# Patient Record
Sex: Male | Born: 1990 | Race: Black or African American | Hispanic: No | Marital: Single | Smoking: Current every day smoker
Health system: Southern US, Community
[De-identification: ages and names within clinical notes are randomized; demographics above are authoritative.]

## PROBLEM LIST (undated history)

## (undated) DIAGNOSIS — S83519A Sprain of anterior cruciate ligament of unspecified knee, initial encounter: Secondary | ICD-10-CM

## (undated) HISTORY — PX: OTHER SURGICAL HISTORY: SHX169

---

## 2015-08-23 ENCOUNTER — Emergency Department (HOSPITAL_COMMUNITY)
Admission: EM | Admit: 2015-08-23 | Discharge: 2015-08-23 | Disposition: A | Discharge status: Home / Self Care | Payer: No Typology Code available for payment source | Attending: Emergency Medicine | Admitting: Emergency Medicine

## 2015-08-23 ENCOUNTER — Encounter (HOSPITAL_COMMUNITY): Payer: Self-pay | Admitting: *Deleted

## 2015-08-23 ENCOUNTER — Emergency Department (HOSPITAL_COMMUNITY): Payer: No Typology Code available for payment source

## 2015-08-23 DIAGNOSIS — Y9389 Activity, other specified: Secondary | ICD-10-CM | POA: Insufficient documentation

## 2015-08-23 DIAGNOSIS — S39012A Strain of muscle, fascia and tendon of lower back, initial encounter: Secondary | ICD-10-CM | POA: Diagnosis not present

## 2015-08-23 DIAGNOSIS — Y998 Other external cause status: Secondary | ICD-10-CM | POA: Diagnosis not present

## 2015-08-23 DIAGNOSIS — S199XXA Unspecified injury of neck, initial encounter: Secondary | ICD-10-CM | POA: Diagnosis present

## 2015-08-23 DIAGNOSIS — Y92410 Unspecified street and highway as the place of occurrence of the external cause: Secondary | ICD-10-CM | POA: Diagnosis not present

## 2015-08-23 DIAGNOSIS — S161XXA Strain of muscle, fascia and tendon at neck level, initial encounter: Secondary | ICD-10-CM | POA: Insufficient documentation

## 2015-08-23 HISTORY — DX: Sprain of anterior cruciate ligament of unspecified knee, initial encounter: S83.519A

## 2015-08-23 MED ORDER — TRAMADOL HCL 50 MG PO TABS: 50.0000 mg | ORAL_TABLET | Freq: Four times a day (QID) | ORAL | Status: AC | PRN

## 2015-08-23 MED ORDER — IBUPROFEN 800 MG PO TABS: 800.0000 mg | ORAL_TABLET | Freq: Three times a day (TID) | ORAL | Status: AC | PRN
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
## 2015-08-23 NOTE — ED Notes (Signed)
Declined W/C at D/C and was escorted to lobby by RN. 
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
## 2018-05-25 ENCOUNTER — Encounter (HOSPITAL_BASED_OUTPATIENT_CLINIC_OR_DEPARTMENT_OTHER): Payer: Self-pay | Admitting: Emergency Medicine

## 2018-05-25 ENCOUNTER — Other Ambulatory Visit: Payer: Self-pay

## 2018-05-25 ENCOUNTER — Emergency Department (HOSPITAL_BASED_OUTPATIENT_CLINIC_OR_DEPARTMENT_OTHER)
Admission: EM | Admit: 2018-05-25 | Discharge: 2018-05-25 | Disposition: A | Discharge status: Home / Self Care | Payer: No Typology Code available for payment source | Attending: Emergency Medicine | Admitting: Emergency Medicine

## 2018-05-25 DIAGNOSIS — M542 Cervicalgia: Secondary | ICD-10-CM | POA: Insufficient documentation

## 2018-05-25 DIAGNOSIS — M25512 Pain in left shoulder: Secondary | ICD-10-CM | POA: Insufficient documentation

## 2018-05-25 DIAGNOSIS — F17210 Nicotine dependence, cigarettes, uncomplicated: Secondary | ICD-10-CM | POA: Diagnosis not present

## 2018-05-25 MED ORDER — CYCLOBENZAPRINE HCL 10 MG PO TABS: 10.0000 mg | ORAL_TABLET | Freq: Every evening | ORAL | 0 refills | Status: AC | PRN
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
## 2018-05-25 NOTE — Discharge Instructions (Signed)
Take Ibuprofen or Tylenol as needed for the next week. Take this medicine with food. Take muscle relaxer at bedtime to help you sleep. This medicine makes you drowsy so do not take before driving or work Use a heating pad for sore muscles - use for 20 minutes several times a day Return for worsening symptoms

## 2018-05-27 ENCOUNTER — Emergency Department (HOSPITAL_COMMUNITY)
Admission: EM | Admit: 2018-05-27 | Discharge: 2018-05-27 | Disposition: A | Discharge status: Home / Self Care | Payer: No Typology Code available for payment source | Attending: Emergency Medicine | Admitting: Emergency Medicine

## 2018-05-27 ENCOUNTER — Encounter (HOSPITAL_COMMUNITY): Payer: Self-pay | Admitting: *Deleted

## 2018-05-27 DIAGNOSIS — M25512 Pain in left shoulder: Secondary | ICD-10-CM | POA: Diagnosis not present

## 2018-05-27 DIAGNOSIS — Z79899 Other long term (current) drug therapy: Secondary | ICD-10-CM | POA: Insufficient documentation

## 2018-05-27 DIAGNOSIS — F17210 Nicotine dependence, cigarettes, uncomplicated: Secondary | ICD-10-CM | POA: Diagnosis not present
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
## 2018-05-27 NOTE — Discharge Instructions (Signed)
Please follow-up with the orthopedics team if your symptoms continue.  Please use your home medicines to help with the symptoms.  Please rest.  If any symptoms change or worsen, please return to the nearest emergency department.

## 2018-06-09 ENCOUNTER — Emergency Department (HOSPITAL_COMMUNITY)
Admission: EM | Admit: 2018-06-09 | Discharge: 2018-06-09 | Disposition: A | Discharge status: Home / Self Care | Payer: Self-pay | Attending: Emergency Medicine | Admitting: Emergency Medicine

## 2018-06-09 ENCOUNTER — Encounter (HOSPITAL_COMMUNITY): Payer: Self-pay | Admitting: *Deleted

## 2018-06-09 DIAGNOSIS — F17210 Nicotine dependence, cigarettes, uncomplicated: Secondary | ICD-10-CM | POA: Insufficient documentation

## 2018-06-09 DIAGNOSIS — L0231 Cutaneous abscess of buttock: Secondary | ICD-10-CM | POA: Insufficient documentation

## 2018-06-09 DIAGNOSIS — Z79899 Other long term (current) drug therapy: Secondary | ICD-10-CM | POA: Insufficient documentation

## 2018-06-09 MED ORDER — LIDOCAINE HCL (PF) 1 % IJ SOLN
5.0000 mL | Freq: Once | INTRAMUSCULAR | Status: AC
  Administered 2018-06-09: 5 mL
  Filled 2018-06-09: qty 5

## 2018-06-09 MED ORDER — DOXYCYCLINE HYCLATE 100 MG PO TABS
100.0000 mg | ORAL_TABLET | Freq: Once | ORAL | Status: AC
  Administered 2018-06-09: 100 mg via ORAL
  Filled 2018-06-09: qty 1

## 2018-06-09 MED ORDER — DOXYCYCLINE HYCLATE 100 MG PO CAPS: 100.0000 mg | ORAL_CAPSULE | Freq: Two times a day (BID) | ORAL | 0 refills | Status: AC

## 2018-06-09 NOTE — ED Triage Notes (Signed)
Pt in c/o abscess on his buttocks, history of same, denies fever, no distress noted

## 2018-06-09 NOTE — Discharge Instructions (Addendum)
Take doxycycline until completed.  Continue warm compresses or warm soaks to continue drainage of your abscess.  Please return in 2 days for recheck.  You can take Tylenol and ibuprofen as prescribed over-the-counter, as needed for pain.  Please return sooner if you develop any increasing pain, redness, swelling, red streaking from the area, pain with bowel movements, pain in your scrotum, or any other new or concerning symptoms.

## 2018-06-10 NOTE — ED Provider Notes (Signed)
[facility] EMERGENCY DEPARTMENT Provider Note   CSN: [id number] Arrival date & time: 06/09/18  1839     History   Chief Complaint Chief Complaint  Patient presents with  . Abscess    HPI Austin Lang is a 28 y.o. male with history of abscess who presents with abscess on his buttocks for the last few days.  He has no pain with bowel movements or pain in his scrotum. There has been no drainage. He denies fevers. He has been using warm compresses without relief. He had an abscess drained in the same place last year at urgent care.  HPI  Past Medical History:  Diagnosis Date  . ACL injury tear    LT knee    There are no active problems to display for this patient.   Past Surgical History:  Procedure Laterality Date  . orthoscop Left    LT ACL repair        Home Medications    Prior to Admission medications   Medication Sig Start Date End Date Taking? Authorizing Provider  cyclobenzaprine (FLEXERIL) 10 MG tablet Take 1 tablet (10 mg total) by mouth at bedtime and may repeat dose one time if needed. 05/25/18   Bethel Born, PA-C  doxycycline (VIBRAMYCIN) 100 MG capsule Take 1 capsule (100 mg total) by mouth 2 (two) times daily. 06/09/18   Lisandro Meggett, Waylan Boga, PA-C  ibuprofen (ADVIL,MOTRIN) 800 MG tablet Take 1 tablet (800 mg total) by mouth every 8 (eight) hours as needed. 08/23/15   Lawyer, Cristal Deer, PA-C  traMADol (ULTRAM) 50 MG tablet Take 1 tablet (50 mg total) by mouth every 6 (six) hours as needed for severe pain. 08/23/15   Charlestine Night, PA-C    Family History History reviewed. No pertinent family history.  Social History Social History   Tobacco Use  . Smoking status: Current Every Day Smoker    Packs/day: 0.50    Types: Cigars  . Smokeless tobacco: Never Used  Substance Use Topics  . Alcohol use: Yes    Comment: weekend  . Drug use: No     Allergies   Patient has no known allergies.   Review of Systems Review  of Systems  Constitutional: Negative for fever.  Skin: Positive for wound.     Physical Exam Updated Vital Signs BP 137/81 (BP Location: Right Arm)   Pulse 90   Temp 98.6 F (37 C) (Oral)   Resp 16   SpO2 99%   Physical Exam Vitals signs and nursing note reviewed. Exam conducted with a chaperone present.  Constitutional:      General: He is not in acute distress.    Appearance: He is well-developed. He is not diaphoretic.  HENT:     Head: Normocephalic and atraumatic.     Mouth/Throat:     Pharynx: No oropharyngeal exudate.  Eyes:     General: No scleral icterus.       Right eye: No discharge.        Left eye: No discharge.     Conjunctiva/sclera: Conjunctivae normal.     Pupils: Pupils are equal, round, and reactive to light.  Neck:     Musculoskeletal: Normal range of motion and neck supple.     Thyroid: No thyromegaly.  Cardiovascular:     Rate and Rhythm: Normal rate and regular rhythm.     Heart sounds: Normal heart sounds. No murmur. No friction rub. No gallop.   Pulmonary:     Effort:  Pulmonary effort is normal. No respiratory distress.     Breath sounds: Normal breath sounds. No stridor. No wheezing or rales.  Abdominal:     General: Bowel sounds are normal. There is no distension.     Palpations: Abdomen is soft.     Tenderness: There is no abdominal tenderness. There is no guarding or rebound.  Genitourinary:    Penis: Normal.      Scrotum/Testes: Normal.       Comments: No tenderness extending to the anus or rectum; no significant erythema noted to the abscess or surrounding area Lymphadenopathy:     Cervical: No cervical adenopathy.  Skin:    General: Skin is warm and dry.     Coloration: Skin is not pale.     Findings: No rash.  Neurological:     Mental Status: He is alert.     Coordination: Coordination normal.      ED Treatments / Results  Labs (all labs ordered are listed, but only abnormal results are displayed) Labs Reviewed - No data  to display  EKG None  Radiology No results found.  Procedures .Marland Kitchen.Incision and Drainage Date/Time: 06/10/2018 10:22 AM Performed by: Emi HolesLaw, Hiyab Nhem M, PA-C Authorized by: Emi HolesLaw, Daleyssa Loiselle M, PA-C   Consent:    Consent obtained:  Verbal   Consent given by:  Patient   Risks discussed:  Bleeding, infection and incomplete drainage   Alternatives discussed:  No treatment Location:    Type:  Abscess   Size:  2   Location:  Anogenital   Anogenital location: gluteal. Pre-procedure details:    Skin preparation:  Chloraprep Anesthesia (see MAR for exact dosages):    Anesthesia method:  Local infiltration   Local anesthetic:  Lidocaine 1% w/o epi Procedure type:    Complexity:  Simple Procedure details:    Needle aspiration: no     Incision types:  Single straight   Incision depth:  Subcutaneous   Scalpel blade:  11   Wound management:  Probed and deloculated and irrigated with saline   Drainage:  Purulent and bloody   Drainage amount:  Copious   Wound treatment:  Wound left open   Packing materials:  None Post-procedure details:    Patient tolerance of procedure:  Tolerated well, no immediate complications Ultrasound ED Soft Tissue Date/Time: 06/10/2018 10:24 AM Performed by: Emi HolesLaw, Encarnacion Bole M, PA-C Authorized by: Emi HolesLaw, Annitta Fifield M, PA-C   Procedure details:    Indications: localization of abscess and evaluate for cellulitis     Transverse view:  Visualized   Longitudinal view:  Visualized   Images: archived   Location:    Location: buttocks     Side:  Right Findings:     abscess present    cellulitis present   (including critical care time)  Medications Ordered in ED Medications  lidocaine (PF) (XYLOCAINE) 1 % injection 5 mL (5 mLs Infiltration Given by Other 06/09/18 2145)  doxycycline (VIBRA-TABS) tablet 100 mg (100 mg Oral Given 06/09/18 2251)     Initial Impression / Assessment and Plan / ED Course  I have reviewed the triage vital signs and the nursing  notes.  Pertinent labs & imaging results that were available during my care of the patient were reviewed by me and considered in my medical decision making (see chart for details).     Patient with skin abscess to right buttock. There was no extension to the perianal or rectal area. Patient has no rectal or anal tenderness. Incision and drainage  performed in the ED today.  Abscess was not large enough to warrant packing or drain placement. Wound recheck in 2 days. Supportive care and return precautions discussed.  Pt sent home with doxycycline. Patient given referral to general surgery considering recurrent abscess. Patient understands and agrees with plan. Patient vitals stable throughout ED course and discharged in satisfactory condition.  Final Clinical Impressions(s) / ED Diagnoses   Final diagnoses:  Abscess of buttock, right    ED Discharge Orders         Ordered    doxycycline (VIBRAMYCIN) 100 MG capsule  2 times daily     06/09/18 2243           Emi Holes, PA-C 06/10/18 1026    Shaune Pollack, MD 06/10/18 1126
